# Patient Record
Sex: Female | Born: 1960 | Hispanic: Yes | Marital: Married
Health system: Midwestern US, Community
[De-identification: ages and names within clinical notes are randomized; demographics above are authoritative.]

## PROBLEM LIST (undated history)

## (undated) DIAGNOSIS — I10 Essential (primary) hypertension: Secondary | ICD-10-CM

## (undated) DIAGNOSIS — E119 Type 2 diabetes mellitus without complications: Secondary | ICD-10-CM

---

## 2017-12-20 ENCOUNTER — Inpatient Hospital Stay
Admit: 2017-12-20 | Discharge: 2017-12-20 | Disposition: A | Discharge status: Home / Self Care | Payer: PRIVATE HEALTH INSURANCE | Attending: Emergency Medicine

## 2017-12-20 DIAGNOSIS — R42 Dizziness and giddiness: Secondary | ICD-10-CM

## 2017-12-20 LAB — UA WITH REFLEX MICRO AND CULTURE
Bilirubin: NEGATIVE
Blood: NEGATIVE
Glucose: NEGATIVE mg/dL
Ketone: NEGATIVE mg/dL
Leukocyte Esterase: NEGATIVE
Nitrites: NEGATIVE
Protein: NEGATIVE mg/dL
Specific gravity: 1.009 (ref 1.001–1.035)
Urobilinogen: 0.2 EU/dL (ref 0.2–1.0)
pH (UA): 7.5 (ref 5–8)

## 2017-12-20 LAB — METABOLIC PANEL, COMPREHENSIVE
A-G Ratio: 0.9 — ABNORMAL LOW (ref 1.0–3.0)
ALT (SGPT): 38 U/L (ref 13–56)
AST (SGOT): 29 U/L (ref 15–37)
Albumin: 3.6 g/dL (ref 3.4–5.0)
Alk. phosphatase: 113 U/L (ref 45–117)
Anion gap: 5 mmol/L (ref 5–15)
BUN/Creatinine ratio: 16 (ref 12–20)
BUN: 12 MG/DL (ref 7–18)
Bilirubin, total: 0.44 mg/dL (ref 0.20–1.20)
CO2: 30 mmol/L (ref 21–32)
Calcium: 9 MG/DL (ref 8.5–10.1)
Chloride: 105 mmol/L (ref 98–110)
Creatinine: 0.76 MG/DL (ref 0.55–1.02)
GFR est AA: >60 mL/min/{1.73_m2} (ref >60)
GFR est non-AA: >60 mL/min/{1.73_m2} (ref >60)
Glucose: 103 mg/dL — ABNORMAL HIGH (ref 70–100)
Potassium: 3.5 mmol/L (ref 3.5–5.1)
Protein, total: 7.7 g/dL (ref 6.4–8.2)
Sodium: 140 mmol/L (ref 136–145)

## 2017-12-20 LAB — CBC WITH AUTOMATED DIFF
ABS. BASOPHILS: 0 10*3/uL (ref 0.0–0.1)
ABS. EOSINOPHILS: 0.1 10*3/uL (ref 0.0–0.5)
ABS. IMM. GRANS.: 0 10*3/uL (ref 0–0.03)
ABS. LYMPHOCYTES: 3 10*3/uL (ref 1.2–3.7)
ABS. MONOCYTES: 0.3 10*3/uL (ref 0.2–0.8)
ABS. NEUTROPHILS: 3.1 10*3/uL (ref 1.6–6.1)
BASOPHILS: 1 % (ref 0–1.2)
EOSINOPHILS: 1 %
HCT: 35.8 % — ABNORMAL LOW (ref 36.0–45.0)
HGB: 11.1 g/dL — ABNORMAL LOW (ref 11.8–14.8)
IMMATURE GRANULOCYTES: 0 % (ref 0–0.4)
LYMPHOCYTES: 47 %
MCH: 26.5 PG (ref 25.6–32.2)
MCHC: 31 g/dL — ABNORMAL LOW (ref 32.2–35.5)
MCV: 85.4 FL (ref 80.0–95.0)
MONOCYTES: 4 %
MPV: 11.8 FL (ref 9.4–12.4)
NEUTROPHILS: 47 %
PLATELET: 214 10*3/uL (ref 150–400)
RBC: 4.19 M/uL (ref 3.93–5.22)
RDW: 14.5 % — ABNORMAL HIGH (ref 11.6–14.4)
WBC: 6.4 10*3/uL (ref 4.0–10.0)

## 2017-12-20 LAB — CBC WITH AUTO DIFFERENTIAL
Basophils %: 1 % (ref 0–1.2)
Basophils Absolute: 0 10*3/uL (ref 0.0–0.1)
Eosinophils %: 1 %
Eosinophils Absolute: 0.1 10*3/uL (ref 0.0–0.5)
Granulocyte Absolute Count: 0 10*3/uL (ref 0–0.03)
Hematocrit: 35.8 % — ABNORMAL LOW (ref 36.0–45.0)
Hemoglobin: 11.1 g/dL — ABNORMAL LOW (ref 11.8–14.8)
Immature Granulocytes: 0 % (ref 0–0.4)
Lymphocytes %: 47 %
Lymphocytes Absolute: 3 10*3/uL (ref 1.2–3.7)
MCH: 26.5 PG (ref 25.6–32.2)
MCHC: 31 g/dL — ABNORMAL LOW (ref 32.2–35.5)
MCV: 85.4 FL (ref 80.0–95.0)
MPV: 11.8 FL (ref 9.4–12.4)
Monocytes %: 4 %
Monocytes Absolute: 0.3 10*3/uL (ref 0.2–0.8)
Neutrophils %: 47 %
Neutrophils Absolute: 3.1 10*3/uL (ref 1.6–6.1)
Platelets: 214 10*3/uL (ref 150–400)
RBC: 4.19 M/uL (ref 3.93–5.22)
RDW: 14.5 % — ABNORMAL HIGH (ref 11.6–14.4)
WBC: 6.4 10*3/uL (ref 4.0–10.0)

## 2017-12-20 LAB — COMPREHENSIVE METABOLIC PANEL
ALT: 38 U/L (ref 13–56)
AST: 29 U/L (ref 15–37)
Albumin/Globulin Ratio: 0.9 — ABNORMAL LOW (ref 1.0–3.0)
Albumin: 3.6 g/dL (ref 3.4–5.0)
Alkaline Phosphatase: 113 U/L (ref 45–117)
Anion Gap: 5 mmol/L (ref 5–15)
BUN: 12 MG/DL (ref 7–18)
Bun/Cre Ratio: 16 NA (ref 12–20)
CO2: 30 mmol/L (ref 21–32)
Calcium: 9 MG/DL (ref 8.5–10.1)
Chloride: 105 mmol/L (ref 98–110)
Creatinine: 0.76 MG/DL (ref 0.55–1.02)
EGFR IF NonAfrican American: >60 mL/min/{1.73_m2} (ref >60)
GFR African American: >60 mL/min/{1.73_m2} (ref >60)
Glucose: 103 mg/dL — ABNORMAL HIGH (ref 70–100)
Potassium: 3.5 mmol/L (ref 3.5–5.1)
Sodium: 140 mmol/L (ref 136–145)
Total Bilirubin: 0.44 mg/dL (ref 0.20–1.20)
Total Protein: 7.7 g/dL (ref 6.4–8.2)

## 2017-12-20 LAB — URINALYSIS WITH REFLEX TO CULTURE
Bilirubin, Urine: NEGATIVE
Blood, Urine: NEGATIVE
Glucose, Ur: NEGATIVE mg/dL
Ketones, Urine: NEGATIVE mg/dL
Leukocyte Esterase, Urine: NEGATIVE
Nitrite, Urine: NEGATIVE
Protein, UA: NEGATIVE mg/dL
Specific Gravity, UA: 1.009 NA (ref 1.001–1.035)
Urobilinogen, UA, POCT: 0.2 EU/dL (ref 0.2–1.0)
pH, UA: 7.5 NA (ref 5–8)

## 2017-12-20 MED ORDER — MECLIZINE 25 MG TAB
25 mg | ORAL_TABLET | Freq: Three times a day (TID) | ORAL | 0 refills | Status: AC | PRN
Start: 2017-12-20 — End: 2017-12-30

## 2017-12-20 MED ORDER — METOCLOPRAMIDE 5 MG/ML IJ SOLN
5 mg/mL | INTRAMUSCULAR | Status: AC
Start: 2017-12-20 — End: 2017-12-20
  Administered 2017-12-20: 15:00:00 via INTRAVENOUS

## 2017-12-20 MED ORDER — MECLIZINE 25 MG TAB
25 mg | ORAL | Status: AC
Start: 2017-12-20 — End: 2017-12-20
  Administered 2017-12-20: 17:00:00 via ORAL

## 2017-12-20 MED ORDER — SODIUM CHLORIDE 0.9% BOLUS IV
0.9 % | Freq: Once | INTRAVENOUS | Status: AC
Start: 2017-12-20 — End: 2017-12-20
  Administered 2017-12-20: 15:00:00 via INTRAVENOUS

## 2017-12-20 MED FILL — METOCLOPRAMIDE 5 MG/ML IJ SOLN: 5 mg/mL | INTRAMUSCULAR | Qty: 2

## 2017-12-20 MED FILL — MECLIZINE 25 MG TAB: 25 mg | ORAL | Qty: 2

## 2017-12-20 MED FILL — SODIUM CHLORIDE 0.9 % IV: INTRAVENOUS | Qty: 1000

## 2017-12-20 NOTE — ED Provider Notes (Signed)
Presents to the ED with a chief complaint of dizziness patient has felt dizzy for the last couple days at night.  Today she felt dizzy when she was at work she has some nausea she denies any chest pain shortness of breath fever chills cough or other associated symptoms says she has a little headache now.           No past medical history on file.    No past surgical history on file.      No family history on file.    Social History     Socioeconomic History   ??? Marital status: MARRIED     Spouse name: Not on file   ??? Number of children: Not on file   ??? Years of education: Not on file   ??? Highest education level: Not on file   Occupational History   ??? Not on file   Social Needs   ??? Financial resource strain: Not on file   ??? Food insecurity:     Worry: Not on file     Inability: Not on file   ??? Transportation needs:     Medical: Not on file     Non-medical: Not on file   Tobacco Use   ??? Smoking status: Not on file   Substance and Sexual Activity   ??? Alcohol use: Not on file   ??? Drug use: Not on file   ??? Sexual activity: Not on file   Lifestyle   ??? Physical activity:     Days per week: Not on file     Minutes per session: Not on file   ??? Stress: Not on file   Relationships   ??? Social connections:     Talks on phone: Not on file     Gets together: Not on file     Attends religious service: Not on file     Active member of club or organization: Not on file     Attends meetings of clubs or organizations: Not on file     Relationship status: Not on file   ??? Intimate partner violence:     Fear of current or ex partner: Not on file     Emotionally abused: Not on file     Physically abused: Not on file     Forced sexual activity: Not on file   Other Topics Concern   ??? Not on file   Social History Narrative   ??? Not on file         ALLERGIES: Patient has no known allergies.    Review of Systems   Constitutional: Negative for fever.   HENT: Negative for congestion.    Eyes: Negative for pain.    Respiratory: Negative for shortness of breath.    Cardiovascular: Negative for chest pain.   Gastrointestinal: Negative for abdominal pain.   Endocrine: Negative for polydipsia.   Neurological: Positive for dizziness and light-headedness.   All other systems reviewed and are negative.      Vitals:    12/20/17 1023 12/20/17 1035   BP: 140/86    Pulse: 72    Resp: 16    Temp: 97.3 ??F (36.3 ??C)    SpO2: 97% 97%   Weight: 70.3 kg (155 lb)    Height: 5' (1.524 m)             Physical Exam   Constitutional: She is oriented to person, place, and time. She appears well-developed.   HENT:  Head: Normocephalic and atraumatic.   Neck: Neck supple.   Cardiovascular: Normal rate.   Neurological: She is alert and oriented to person, place, and time.   Psychiatric: She has a normal mood and affect.   Nursing note and vitals reviewed.       MDM  Number of Diagnoses or Management Options  Diagnosis management comments: Presents to the ED with a chief complaint of dizziness.  Suspect patient has vertigo she has a normal neurological exam in not think that this is and ataxia or a neurological process she will be discharged home she was given medication the ED she will be given a prescription for meclizine at home she is hemodynamically stable nontoxic non-ill-appearing.    ED Course as of Dec 20 1140   Tue Dec 20, 2017   1141 EKG:  Shows sinus rhythm the rate of 58 no acute ischemia.    [BL]      ED Course User Index  [BL] Raeford Razor., DO       Procedures      NIH Stroke Scale

## 2017-12-20 NOTE — Other (Signed)
Pt verbalizes understanding of meclizine, follow up.

## 2017-12-20 NOTE — ED Notes (Signed)
Waiting for meclizine from pharmacy

## 2017-12-20 NOTE — ED Provider Notes (Signed)
Presents to the ED with a chief complaint of dizziness patient has felt dizzy for the last couple days at night.  Today she felt dizzy when she was at work she has some nausea she denies any chest pain shortness of breath fever chills cough or other associated symptoms says she has a little headache now.           No past medical history on file.    No past surgical history on file.      No family history on file.    Social History     Socioeconomic History   ??? Marital status: MARRIED     Spouse name: Not on file   ??? Number of children: Not on file   ??? Years of education: Not on file   ??? Highest education level: Not on file   Occupational History   ??? Not on file   Social Needs   ??? Financial resource strain: Not on file   ??? Food insecurity:     Worry: Not on file     Inability: Not on file   ??? Transportation needs:     Medical: Not on file     Non-medical: Not on file   Tobacco Use   ??? Smoking status: Not on file   Substance and Sexual Activity   ??? Alcohol use: Not on file   ??? Drug use: Not on file   ??? Sexual activity: Not on file   Lifestyle   ??? Physical activity:     Days per week: Not on file     Minutes per session: Not on file   ??? Stress: Not on file   Relationships   ??? Social connections:     Talks on phone: Not on file     Gets together: Not on file     Attends religious service: Not on file     Active member of club or organization: Not on file     Attends meetings of clubs or organizations: Not on file     Relationship status: Not on file   ??? Intimate partner violence:     Fear of current or ex partner: Not on file     Emotionally abused: Not on file     Physically abused: Not on file     Forced sexual activity: Not on file   Other Topics Concern   ??? Not on file   Social History Narrative   ??? Not on file         ALLERGIES: Patient has no known allergies.    Review of Systems   Constitutional: Negative for fever.   HENT: Negative for congestion.    Eyes: Negative for pain.   Respiratory: Negative for  shortness of breath.    Cardiovascular: Negative for chest pain.   Gastrointestinal: Negative for abdominal pain.   Endocrine: Negative for polydipsia.   Neurological: Positive for dizziness and light-headedness.   All other systems reviewed and are negative.      Vitals:    12/20/17 1023 12/20/17 1035   BP: 140/86    Pulse: 72    Resp: 16    Temp: 97.3 ??F (36.3 ??C)    SpO2: 97% 97%   Weight: 70.3 kg (155 lb)    Height: 5' (1.524 m)             Physical Exam   Constitutional: She is oriented to person, place, and time. She appears well-developed.   HENT:  Head: Normocephalic and atraumatic.   Neck: Neck supple.   Cardiovascular: Normal rate.   Neurological: She is alert and oriented to person, place, and time.   Psychiatric: She has a normal mood and affect.   Nursing note and vitals reviewed.       MDM  Number of Diagnoses or Management Options  Diagnosis management comments: Presents to the ED with a chief complaint of dizziness.  Suspect patient has vertigo she has a normal neurological exam in not think that this is and ataxia or a neurological process she will be discharged home she was given medication the ED she will be given a prescription for meclizine at home she is hemodynamically stable nontoxic non-ill-appearing.    ED Course as of Dec 20 1140   Tue Dec 20, 2017   1141 EKG:  Shows sinus rhythm the rate of 58 no acute ischemia.    [BL]      ED Course User Index  [BL] Raeford Razor., DO       Procedures      NIH Stroke Scale

## 2017-12-20 NOTE — ED Notes (Signed)
Waiting for meclizine from pharmacy

## 2019-08-22 ENCOUNTER — Other Ambulatory Visit: Payer: Self-pay | Admitting: Physician Assistant

## 2019-08-22 DIAGNOSIS — R220 Localized swelling, mass and lump, head: Secondary | ICD-10-CM

## 2019-08-28 ENCOUNTER — Other Ambulatory Visit: Payer: Self-pay

## 2019-08-28 ENCOUNTER — Ambulatory Visit
Admission: RE | Admit: 2019-08-28 | Discharge: 2019-08-28 | Disposition: A | Discharge status: Home / Self Care | Payer: Self-pay | Source: Ambulatory Visit | Attending: Physician Assistant | Admitting: Physician Assistant

## 2019-08-28 DIAGNOSIS — R220 Localized swelling, mass and lump, head: Secondary | ICD-10-CM | POA: Insufficient documentation

## 2019-09-04 ENCOUNTER — Other Ambulatory Visit: Payer: Self-pay | Admitting: Physician Assistant

## 2019-09-04 DIAGNOSIS — R221 Localized swelling, mass and lump, neck: Secondary | ICD-10-CM

## 2019-09-08 DIAGNOSIS — R221 Localized swelling, mass and lump, neck: Secondary | ICD-10-CM | POA: Insufficient documentation

## 2019-09-08 NOTE — Progress Notes (Signed)
[place]  [phone] [phone]) [phone]  ID: Dossie Der OB: 1960-12-16  MR#: [id number]  RJJ#:[id number]  Patient Care Team: Crissie Figures, Hershal Coria as PCP - General (Physician Assistant) Lloyd Huger, MD as Consulting Physician (Oncology)  CHIEF COMPLAINT: Left neck mass.  INTERVAL HISTORY: Patient is a 59 year old female who noticed an enlarging painless left neck mass approximately 1 month ago.  She otherwise feels well and is asymptomatic.  She has no neurologic complaints.  She denies any recent fevers or illnesses.  She has good appetite and denies weight loss.  She denies any pain or dysphagia.  She has no difficulty swallowing.  She has no chest pain, shortness of breath, cough, or hemoptysis.  She denies any nausea, vomiting, constipation, or diarrhea.  She has no melena or hematochezia.  She has no urinary complaints.  Patient otherwise feels well and offers no further specific complaints today.  REVIEW OF SYSTEMS:   Review of Systems  Constitutional: Negative.  Negative for fever, malaise/fatigue and weight loss.  HENT: Negative.  Negative for sore throat.   Respiratory: Negative.  Negative for cough, hemoptysis, shortness of breath and stridor.   Cardiovascular: Negative.  Negative for chest pain and leg swelling.  Gastrointestinal: Negative.  Negative for abdominal pain.  Genitourinary: Negative.  Negative for dysuria.  Musculoskeletal: Negative.  Negative for back pain.  Skin: Negative.  Negative for rash.  Neurological: Negative.  Negative for dizziness, focal weakness, weakness and headaches.  Psychiatric/Behavioral: Negative.  The patient is not nervous/anxious.     As per HPI. Otherwise, a complete review of systems is negative.  PAST MEDICAL HISTORY: History reviewed. No pertinent past medical history.  PAST SURGICAL HISTORY: History reviewed. No pertinent surgical history.  FAMILY HISTORY: History reviewed. No  pertinent family history.  ADVANCED DIRECTIVES (Y/N):  N  HEALTH MAINTENANCE: Social History   Tobacco Use  . Smoking status: Never Smoker  . Smokeless tobacco: Never Used  Substance Use Topics  . Alcohol use: Not Currently  . Drug use: Not on file     Colonoscopy:  PAP:  Bone density:  Lipid panel:  No Known Allergies  Current Outpatient Medications  Medication Sig Dispense Refill  . b complex vitamins tablet Take 1 tablet by mouth daily.    . Calcium Carb-Cholecalciferol (CALCIUM 1000 + D PO) Take 1 tablet by mouth 3 (three) times a week.    Marland Kitchen lisinopril (ZESTRIL) 5 MG tablet Take 5 mg by mouth daily.    . metFORMIN (GLUCOPHAGE) 500 MG tablet Take 500 mg by mouth 2 (two) times daily with a meal.     No current facility-administered medications for this visit.    OBJECTIVE: Vitals:   09/10/19 1535  BP: 125/72  Pulse: (!) 59  Resp: 20  Temp: (!) 97 F (36.1 C)  SpO2: 100%     There is no height or weight on file to calculate BMI.    ECOG FS:0 - Asymptomatic  General: Well-developed, well-nourished, no acute distress. Eyes: Pink conjunctiva, anicteric sclera. HEENT: Normocephalic, moist mucous membranes.  Easily palpable 2 to 3 cm left cervical mass. Lungs: No audible wheezing or coughing. Heart: Regular rate and rhythm. Abdomen: Soft, nontender, no obvious distention. Musculoskeletal: No edema, cyanosis, or clubbing. Neuro: Alert, answering all questions appropriately. Cranial nerves grossly intact. Skin: No rashes or petechiae noted. Psych: Normal affect. Lymphatics: No cervical, calvicular, axillary or inguinal LAD.   LAB RESULTS:  Lab Results  Component Value Date  NA 137 09/10/2019   K 3.8 09/10/2019   CL 102 09/10/2019   CO2 27 09/10/2019   GLUCOSE 112 (H) 09/10/2019   BUN 14 09/10/2019   CREATININE 0.83 09/10/2019   CALCIUM 9.2 09/10/2019   PROT 8.2 (H) 09/10/2019   ALBUMIN 4.4 09/10/2019   AST 20 09/10/2019   ALT 22 09/10/2019   ALKPHOS  85 09/10/2019   BILITOT 1.2 09/10/2019   GFRNONAA >60 09/10/2019   GFRAA >60 09/10/2019    Lab Results  Component Value Date   WBC 6.6 09/10/2019   NEUTROABS 2.4 09/10/2019   HGB 11.7 (L) 09/10/2019   HCT 37.2 09/10/2019   MCV 83.0 09/10/2019   PLT 223 09/10/2019     STUDIES: US SOFT TISSUE HEAD & NECK (NON-THYROID)  Result Date: 08/29/2019 CLINICAL DATA:  Left submandibular region mass. Noticed for 1 month. EXAM: ULTRASOUND OF HEAD/NECK SOFT TISSUES TECHNIQUE: Ultrasound examination of the head and neck soft tissues was performed in the area of clinical concern. COMPARISON:  None. FINDINGS: In the region of concern, the technologist measures a 3.5 x 1.1 x 2.9 cm structure which is indeterminate in nature based on the ultrasound images. This appears to be immediately adjacent to what looks like normal submandibular tissue. This does not look like a cyst or a normal lymph node. The differential diagnosis is that of pathologic lymph node versus exophytic submandibular mass. Vascularity is fairly low. IMPRESSION: 3.5 x 1.1 x 2.9 cm soft tissue mass apparently adjacent to what looks to be normal appearing submandibular tissue. Differential diagnosis is exophytic submandibular tumor versus pathologic lymph node. CT scan of the neck with and without contrast suggested for further evaluation. Electronically Signed   By: Paulina Fusi M.D.   On: 08/29/2019 10:50    ASSESSMENT: Left neck mass  PLAN:    1. Left neck mass: Unclear etiology.  Ultrasound results from Aug 29, 2019 reviewed independently reported as above 3.5 cm mass.  Will get CT scan of the neck for further evaluation.  Ultimately, patient will require biopsy to determine whether mass is benign or malignant.  Return to clinic in 1 week to discuss the CT scan results and additional diagnostic testing if necessary.  The entire visit was done in the presence of of interpreter.  I spent a total of 60 minutes reviewing chart data,  face-to-face evaluation with the patient, counseling and coordination of care as detailed above.   Patient expressed understanding and was in agreement with this plan. She also understands that She can call clinic at any time with any questions, concerns, or complaints.   Cancer Staging No matching staging information was found for the patient.  Jeralyn Ruths, MD   09/11/2019 6:44 AM

## 2019-09-10 ENCOUNTER — Other Ambulatory Visit: Payer: Self-pay

## 2019-09-10 ENCOUNTER — Inpatient Hospital Stay: Payer: Medicaid - Out of State

## 2019-09-10 ENCOUNTER — Encounter: Payer: Self-pay | Admitting: Oncology

## 2019-09-10 ENCOUNTER — Inpatient Hospital Stay: Payer: Medicaid - Out of State | Attending: Oncology | Admitting: Oncology

## 2019-09-10 DIAGNOSIS — R221 Localized swelling, mass and lump, neck: Secondary | ICD-10-CM | POA: Insufficient documentation

## 2019-09-10 LAB — CBC WITH DIFFERENTIAL/PLATELET
Abs Immature Granulocytes: 0.01 10*3/uL (ref 0.00–0.07)
Basophils Absolute: 0 10*3/uL (ref 0.0–0.1)
Basophils Relative: 1 %
Eosinophils Absolute: 0.1 10*3/uL (ref 0.0–0.5)
Eosinophils Relative: 1 %
HCT: 37.2 % (ref 36.0–46.0)
Hemoglobin: 11.7 g/dL — ABNORMAL LOW (ref 12.0–15.0)
Immature Granulocytes: 0 %
Lymphocytes Relative: 56 %
Lymphs Abs: 3.7 10*3/uL (ref 0.7–4.0)
MCH: 26.1 pg (ref 26.0–34.0)
MCHC: 31.5 g/dL (ref 30.0–36.0)
MCV: 83 fL (ref 80.0–100.0)
Monocytes Absolute: 0.4 10*3/uL (ref 0.1–1.0)
Monocytes Relative: 5 %
Neutro Abs: 2.4 10*3/uL (ref 1.7–7.7)
Neutrophils Relative %: 37 %
Platelets: 223 10*3/uL (ref 150–400)
RBC: 4.48 MIL/uL (ref 3.87–5.11)
RDW: 14.2 % (ref 11.5–15.5)
WBC: 6.6 10*3/uL (ref 4.0–10.5)
nRBC: 0 % (ref 0.0–0.2)

## 2019-09-10 LAB — COMPREHENSIVE METABOLIC PANEL
ALT: 22 U/L (ref 0–44)
AST: 20 U/L (ref 15–41)
Albumin: 4.4 g/dL (ref 3.5–5.0)
Alkaline Phosphatase: 85 U/L (ref 38–126)
Anion gap: 8 (ref 5–15)
BUN: 14 mg/dL (ref 6–20)
CO2: 27 mmol/L (ref 22–32)
Calcium: 9.2 mg/dL (ref 8.9–10.3)
Chloride: 102 mmol/L (ref 98–111)
Creatinine, Ser: 0.83 mg/dL (ref 0.44–1.00)
GFR calc Af Amer: >60 mL/min (ref >60)
GFR calc non Af Amer: >60 mL/min (ref >60)
Glucose, Bld: 112 mg/dL — ABNORMAL HIGH (ref 70–99)
Potassium: 3.8 mmol/L (ref 3.5–5.1)
Sodium: 137 mmol/L (ref 135–145)
Total Bilirubin: 1.2 mg/dL (ref 0.3–1.2)
Total Protein: 8.2 g/dL — ABNORMAL HIGH (ref 6.5–8.1)

## 2019-09-10 LAB — PROTIME-INR
INR: 1 (ref 0.8–1.2)
Prothrombin Time: 12.3 seconds (ref 11.4–15.2)

## 2019-09-10 LAB — APTT: aPTT: 29 seconds (ref 24–36)

## 2019-09-13 ENCOUNTER — Other Ambulatory Visit: Payer: Self-pay

## 2019-09-13 ENCOUNTER — Ambulatory Visit
Admission: RE | Admit: 2019-09-13 | Discharge: 2019-09-13 | Disposition: A | Discharge status: Home / Self Care | Payer: Medicaid - Out of State | Source: Ambulatory Visit | Attending: Oncology | Admitting: Oncology

## 2019-09-13 DIAGNOSIS — R221 Localized swelling, mass and lump, neck: Secondary | ICD-10-CM | POA: Diagnosis not present

## 2019-09-13 HISTORY — DX: Essential (primary) hypertension: I10

## 2019-09-13 HISTORY — DX: Type 2 diabetes mellitus without complications: E11.9

## 2019-09-13 MED ORDER — IOHEXOL 300 MG/ML  SOLN
75.0000 mL | Freq: Once | INTRAMUSCULAR | Status: AC | PRN
  Administered 2019-09-13: 75 mL via INTRAVENOUS

## 2019-09-14 ENCOUNTER — Inpatient Hospital Stay (HOSPITAL_BASED_OUTPATIENT_CLINIC_OR_DEPARTMENT_OTHER): Payer: Medicaid - Out of State | Admitting: Oncology

## 2019-09-14 ENCOUNTER — Encounter: Payer: Self-pay | Admitting: Oncology

## 2019-09-14 VITALS — BP 112/69 | HR 64 | Temp 98.0°F | Wt 153.5 lb

## 2019-09-14 DIAGNOSIS — R221 Localized swelling, mass and lump, neck: Secondary | ICD-10-CM

## 2019-09-14 NOTE — Progress Notes (Signed)
[facility]  Telephone:(336) [phone] [phone]) [phone]  ID: Connie Griffin OB: 10/19/1960  MR#: [id number]  [id number]#:[id number]  Patient Care Team: Gorden Harms, Cordelia Poche as PCP - General (Physician Assistant) Jeralyn Ruths, MD as Consulting Physician (Oncology)  CHIEF COMPLAINT: Left neck mass.  INTERVAL HISTORY: Patient returns to clinic today for further evaluation and discussion of her imaging results.  She complains of left ear pain as well as weakness and fatigue, but otherwise feels well. She has no neurologic complaints.  She denies any recent fevers or illnesses.  She has a good appetite and denies weight loss.  She denies any dysphagia.  She has no difficulty swallowing.  She has no chest pain, shortness of breath, cough, or hemoptysis.  She denies any nausea, vomiting, constipation, or diarrhea.  She has no melena or hematochezia.  She has no urinary complaints.  Patient offers no further specific complaints today.    REVIEW OF SYSTEMS:   Review of Systems  Constitutional: Positive for malaise/fatigue. Negative for fever and weight loss.  HENT: Positive for ear pain. Negative for sore throat.   Respiratory: Negative.  Negative for cough, hemoptysis, shortness of breath and stridor.   Cardiovascular: Negative.  Negative for chest pain and leg swelling.  Gastrointestinal: Negative.  Negative for abdominal pain.  Genitourinary: Negative.  Negative for dysuria.  Musculoskeletal: Negative.  Negative for back pain.  Skin: Negative.  Negative for rash.  Neurological: Positive for weakness. Negative for dizziness, focal weakness and headaches.  Psychiatric/Behavioral: Negative.  The patient is not nervous/anxious.     As per HPI. Otherwise, a complete review of systems is negative.  PAST MEDICAL HISTORY: Past Medical History:  Diagnosis Date  . Diabetes mellitus without complication (HCC)   . Hypertension     PAST SURGICAL HISTORY: History reviewed. No  pertinent surgical history.  FAMILY HISTORY: History reviewed. No pertinent family history.  ADVANCED DIRECTIVES (Y/N):  N  HEALTH MAINTENANCE: Social History   Tobacco Use  . Smoking status: Never Smoker  . Smokeless tobacco: Never Used  Substance Use Topics  . Alcohol use: Not Currently  . Drug use: Not on file     Colonoscopy:  PAP:  Bone density:  Lipid panel:  No Known Allergies  Current Outpatient Medications  Medication Sig Dispense Refill  . b complex vitamins tablet Take 1 tablet by mouth daily.    . Calcium Carb-Cholecalciferol (CALCIUM 1000 + D PO) Take 1 tablet by mouth 3 (three) times a week.    Marland Kitchen lisinopril (ZESTRIL) 5 MG tablet Take 5 mg by mouth daily.    . metFORMIN (GLUCOPHAGE) 500 MG tablet Take 500 mg by mouth 2 (two) times daily with a meal.     No current facility-administered medications for this visit.    OBJECTIVE: Vitals:   09/14/19 1308  BP: 112/69  Pulse: 64  Temp: 98 F (36.7 C)  SpO2: 100%     There is no height or weight on file to calculate BMI.    ECOG FS:0 - Asymptomatic  General: Well-developed, well-nourished, no acute distress. Eyes: Pink conjunctiva, anicteric sclera. HEENT: Normocephalic, moist mucous membranes.  Easily palpable 2-3 cm mass on left neck. Lungs: No audible wheezing or coughing. Heart: Regular rate and rhythm. Abdomen: Soft, nontender, no obvious distention. Musculoskeletal: No edema, cyanosis, or clubbing. Neuro: Alert, answering all questions appropriately. Cranial nerves grossly intact. Skin: No rashes or petechiae noted. Psych: Normal affect.  LAB RESULTS:  Lab Results  Component Value Date  NA 137 09/10/2019   K 3.8 09/10/2019   CL 102 09/10/2019   CO2 27 09/10/2019   GLUCOSE 112 (H) 09/10/2019   BUN 14 09/10/2019   CREATININE 0.83 09/10/2019   CALCIUM 9.2 09/10/2019   PROT 8.2 (H) 09/10/2019   ALBUMIN 4.4 09/10/2019   AST 20 09/10/2019   ALT 22 09/10/2019   ALKPHOS 85 09/10/2019    BILITOT 1.2 09/10/2019   GFRNONAA >60 09/10/2019   GFRAA >60 09/10/2019    Lab Results  Component Value Date   WBC 6.6 09/10/2019   NEUTROABS 2.4 09/10/2019   HGB 11.7 (L) 09/10/2019   HCT 37.2 09/10/2019   MCV 83.0 09/10/2019   PLT 223 09/10/2019     STUDIES: CT SOFT TISSUE NECK W CONTRAST  Result Date: 09/14/2019 CLINICAL DATA:  Painless left neck mass. EXAM: CT NECK WITH CONTRAST TECHNIQUE: Multidetector CT imaging of the neck was performed using the standard protocol following the bolus administration of intravenous contrast. CONTRAST:  59mL OMNIPAQUE IOHEXOL 300 MG/ML  SOLN COMPARISON:  Neck ultrasound 08/28/2019 FINDINGS: Pharynx and larynx: No evidence of mass or swelling. Patent airway. No retropharyngeal fluid. Salivary glands: The submandibular and parotid glands themselves are normal in appearance. There is a homogeneous fat density mass located lateral and superior to the left submandibular gland which abuts the parotid tail and measures 2.6 x 2.4 x 2.6 cm (AP x transverse x craniocaudal). Thyroid: Unremarkable. Lymph nodes: No enlarged or suspicious lymph nodes in the neck. Vascular: Major vascular structures of the neck are patent. Limited intracranial: Unremarkable. Visualized orbits: Unremarkable. Mastoids and visualized paranasal sinuses: Clear. Skeleton: Unerupted left maxillary molar tooth projecting into the posteroinferior aspect of the maxillary sinus. No suspicious osseous lesion. Upper chest: Clear lung apices. Other: None. 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 Electronically Signed   By: Logan Bores M.D.   On: 09/14/2019 12:51   US SOFT TISSUE HEAD & NECK (NON-THYROID)  Result Date: 08/29/2019 CLINICAL DATA:  Left submandibular region mass. Noticed for 1 month. EXAM: ULTRASOUND OF HEAD/NECK SOFT TISSUES TECHNIQUE: Ultrasound examination of the head and neck soft tissues was performed in the area of clinical concern.  COMPARISON:  None. FINDINGS: In the region of concern, the technologist measures a 3.5 x 1.1 x 2.9 cm structure which is indeterminate in nature based on the ultrasound images. This appears to be immediately adjacent to what looks like normal submandibular tissue. This does not look like a cyst or a normal lymph node. The differential diagnosis is that of pathologic lymph node versus exophytic submandibular mass. Vascularity is fairly low. IMPRESSION: 3.5 x 1.1 x 2.9 cm soft tissue mass apparently adjacent to what looks to be normal appearing submandibular tissue. Differential diagnosis is exophytic submandibular tumor versus pathologic lymph node. CT scan of the neck with and without contrast suggested for further evaluation. Electronically Signed   By: Nelson Chimes M.D.   On: 08/29/2019 10:50    ASSESSMENT: Left neck mass  PLAN:    1. 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  CT scan results from Sep 14, 2019 reviewed independently and reported as above.  No intervention is needed at this time.  Patient does not require biopsy.  Given her ongoing ear pain, will refer patient to ENT for further evaluation.  No further follow-up is necessary in the cancer center. 2.  Weakness/fatigue: Likely multifactorial.  Patient has a mild anemia of 11.7 which is likely not contributing.  Have  recommended follow-up with her primary care as indicated. 3.  Anemia: Patient has a mildly decreased hemoglobin of 11.7.    The entire visit was done in the presence of of interpreter.    Patient expressed understanding and was in agreement with this plan. She also understands that She can call clinic at any time with any questions, concerns, or complaints.   Cancer Staging No matching staging information was found for the patient.  Jeralyn Ruths, MD   09/14/2019 1:39 PM

## 2019-09-14 NOTE — Progress Notes (Signed)
Patient here today for follow up results. States she is having some pain in left ear along with itching that just started. Also states she is feeling very weak and is concerned her iron level is low. Would like to know if she needs iron treatment. She also reports dizziness, coughing and some frequent constipation.

## 2019-09-19 ENCOUNTER — Telehealth: Payer: Self-pay

## 2019-09-19 NOTE — Telephone Encounter (Signed)
Rinaldo Cloud from Dr. Narda Bonds office called today and stated they would not be able to treat patient. She states patient's insurance would not cover because they are considered out of network. Routing to provider to advise.

## 2019-09-20 NOTE — Telephone Encounter (Signed)
Referral has been faxed to [facility].

## 2020-12-10 IMAGING — CT CT NECK W/ CM
5 series · 16 of 33 positions shown, 18 images · IV contrast (omnipaque)
Comparison: Neck ultrasound 08/28/2019

CLINICAL DATA: Painless left neck mass.

EXAM:
CT NECK WITH CONTRAST
TECHNIQUE: Multidetector CT imaging of the neck was performed using the
standard protocol following the bolus administration of intravenous
contrast.
CONTRAST:  75mL OMNIPAQUE IOHEXOL 300 MG/ML  SOLN

[Series 2: axial neck neck (person_name) 2.00 · axial · 0.58mm/px · z∈[-651,-579]mm · 2 of 110 slices shown]
[im 37/110  bone]
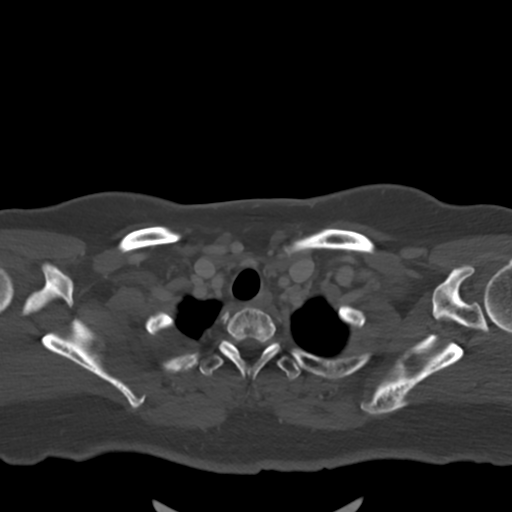
[im 73/110  bone]
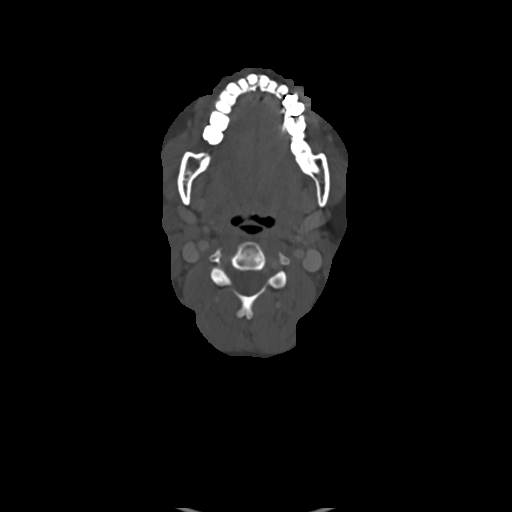

[Series 3: axial bone neck 2.00 · axial · 0.58mm/px · z∈[-651,-579]mm · 2 of 110 slices shown]
[im 37/110  bone]
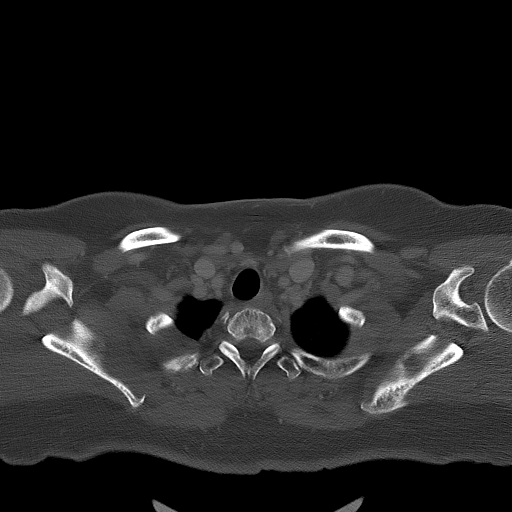
[im 73/110  bone]
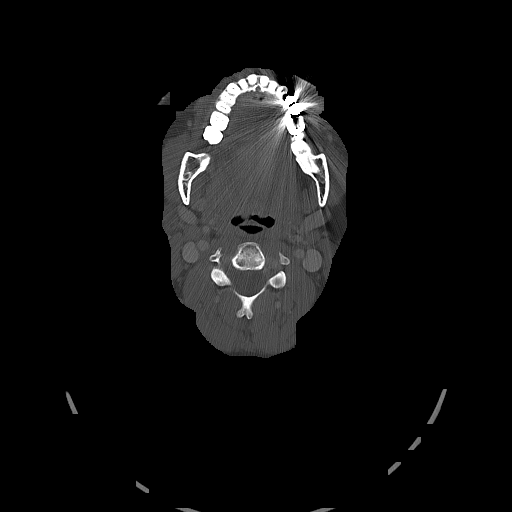

[Series 4: coronal neck neck (person_name) 2.00 cor · coronal · 0.53mm/px · 3 of 140 slices shown]
[im 54/140  bone]
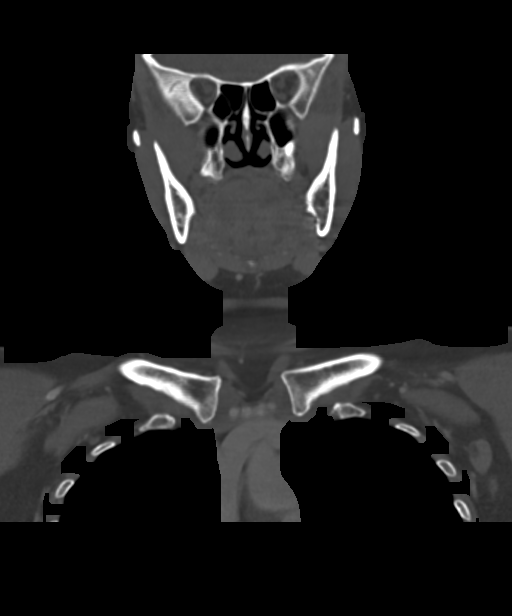
[im 65/140  bone]
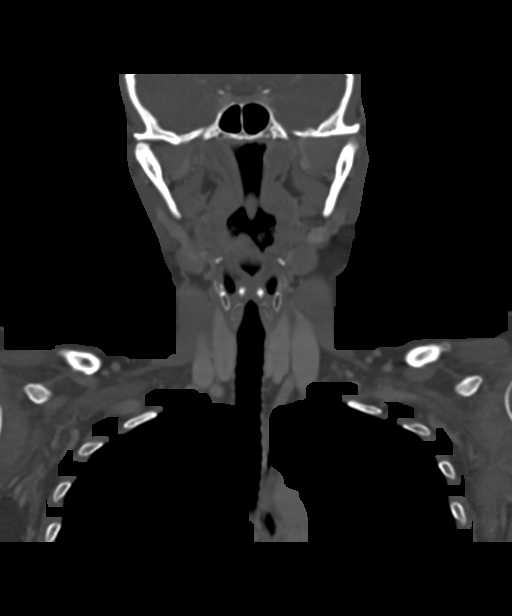
[im 76/140  bone]
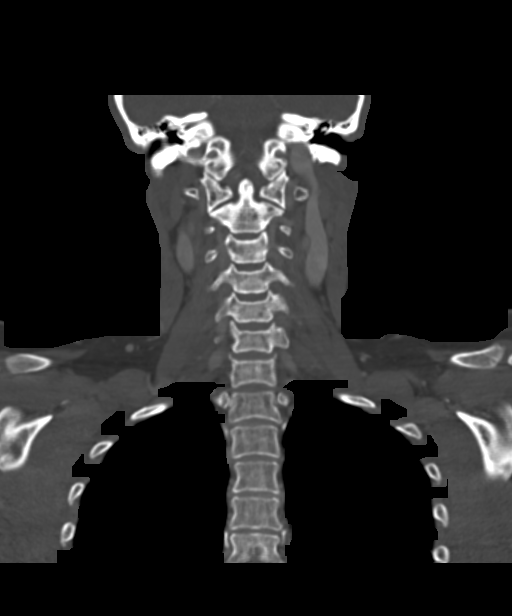

[Series 6: sagittal neck neck (person_name) 2.00 sag · sagittal · 0.55mm/px · 5 of 135 slices shown, 6 images]
[im 45/135  bone]
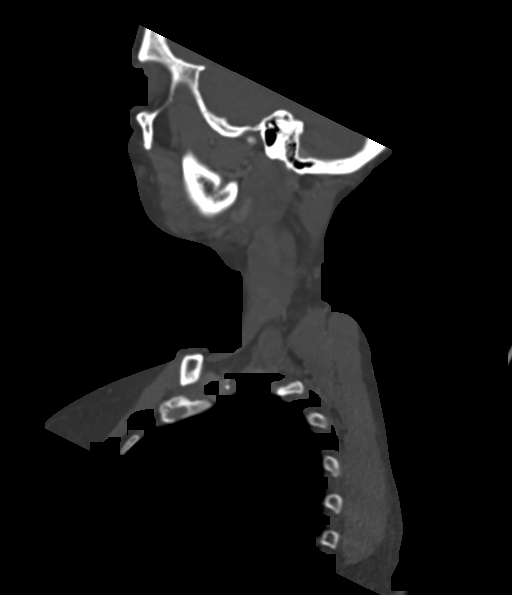
[im 56/135  bone]
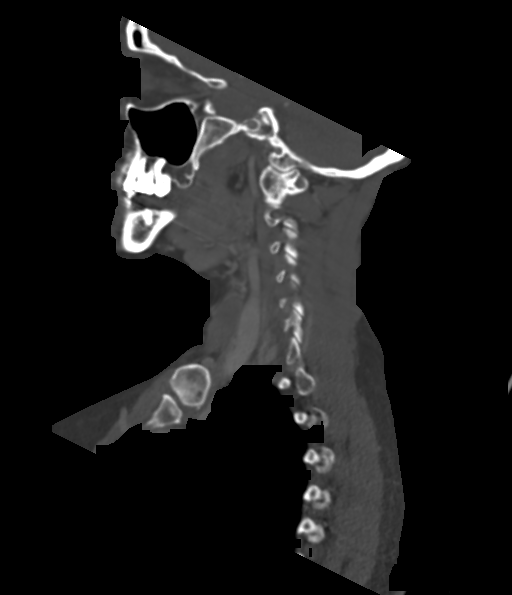
[im 68/135  soft-tissue]
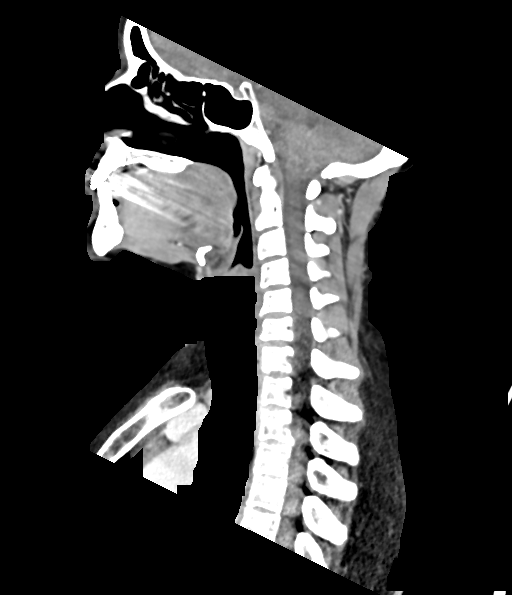
[im 68/135  bone]
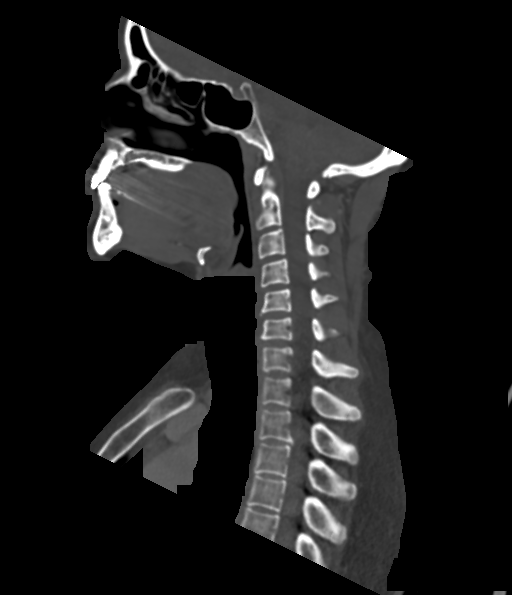
[im 79/135  bone]
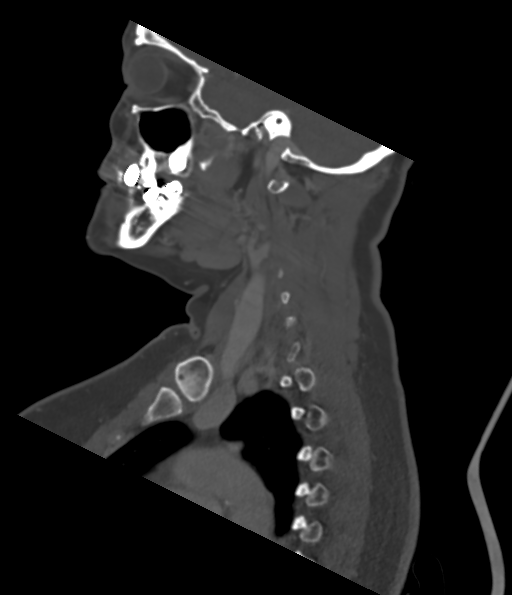
[im 90/135  bone]
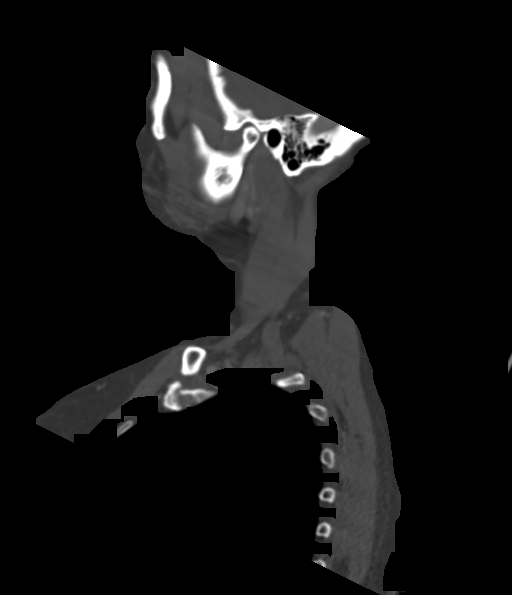

[Series 8: ax oropharynx neck neck (person_name) 2.00 ax · axial · 0.53mm/px · z∈[-757,-585]mm · 4 of 162 slices shown, 5 images]
[im 33/162  soft-tissue]
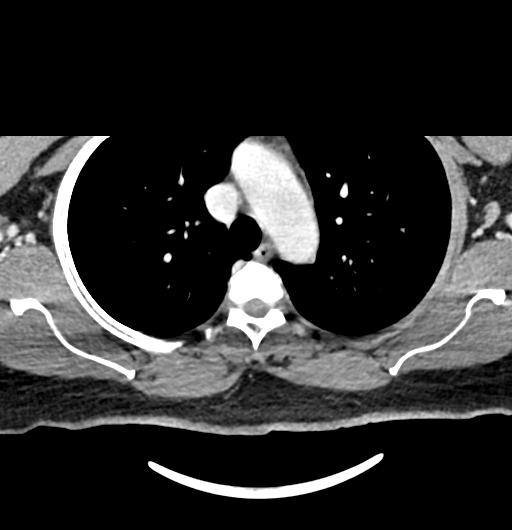
[im 33/162  bone]
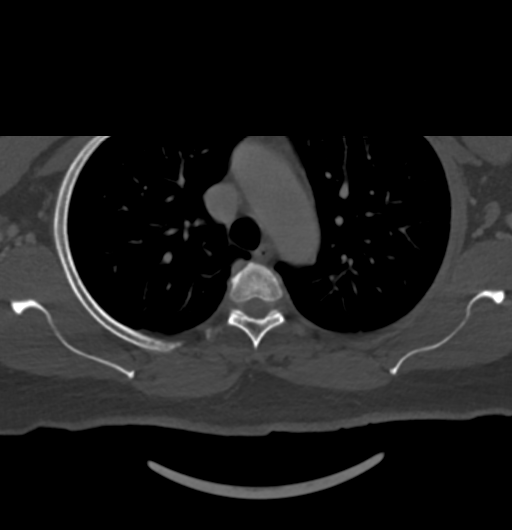
[im 65/162  bone]
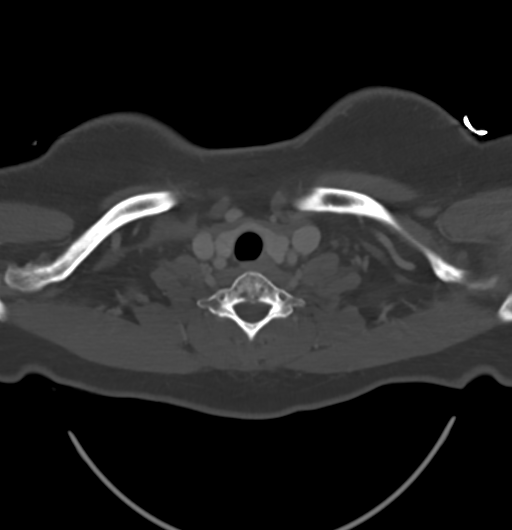
[im 97/162  bone]
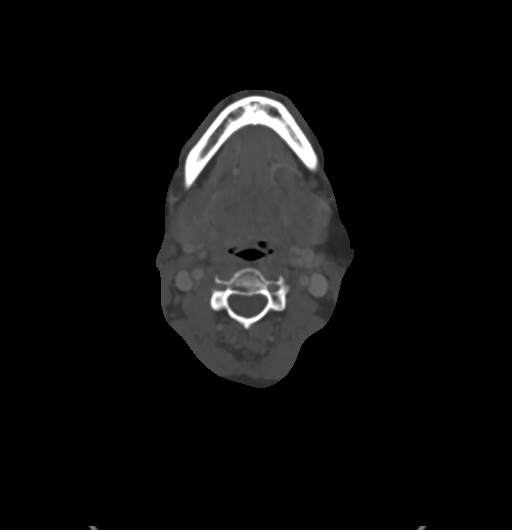
[im 129/162  bone]
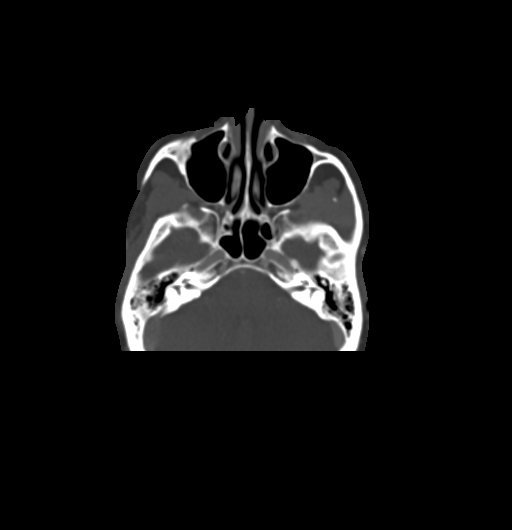

[16 of 33 positions shown; findings below may reference images not displayed]

FINDINGS: Pharynx and larynx: No evidence of mass or swelling. Patent airway.
No retropharyngeal fluid.

Salivary glands: The submandibular and parotid glands themselves are
normal in appearance. There is a homogeneous fat density mass
located lateral and superior to the left submandibular gland which
abuts the parotid tail and measures 2.6 x 2.4 x 2.6 cm (AP x
transverse x craniocaudal).

Thyroid: Unremarkable.

Lymph nodes: No enlarged or suspicious lymph nodes in the neck.

Vascular: Major vascular structures of the neck are patent.

Limited intracranial: Unremarkable.

Visualized orbits: Unremarkable.

Mastoids and visualized paranasal sinuses: Clear.

Skeleton: Unerupted left maxillary molar tooth projecting into the
posteroinferior aspect of the maxillary sinus. No suspicious osseous
lesion.

Upper chest: Clear lung apices.

Other: None.
IMPRESSION: 2.6 cm lipoma located between the left submandibular gland and
parotid tail.
